# Patient Record
Sex: Male | Born: 2005 | Race: Black or African American | Hispanic: No | Marital: Single
Health system: Southern US, Community
[De-identification: ages and names within clinical notes are randomized; demographics above are authoritative.]

## PROBLEM LIST (undated history)

## (undated) DIAGNOSIS — F909 Attention-deficit hyperactivity disorder, unspecified type: Secondary | ICD-10-CM

## (undated) DIAGNOSIS — F913 Oppositional defiant disorder: Secondary | ICD-10-CM

## (undated) HISTORY — PX: CIRCUMCISION: SUR203

---
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
## 2024-01-03 IMAGING — DX DG CHEST 2V
2 series · 2 of 2 positions shown · non-contrast
Comparison: July 02, 2013.

CLINICAL DATA: Cough, history of spot on lung.

EXAM:
CHEST - 2 VIEW

[chest pa]
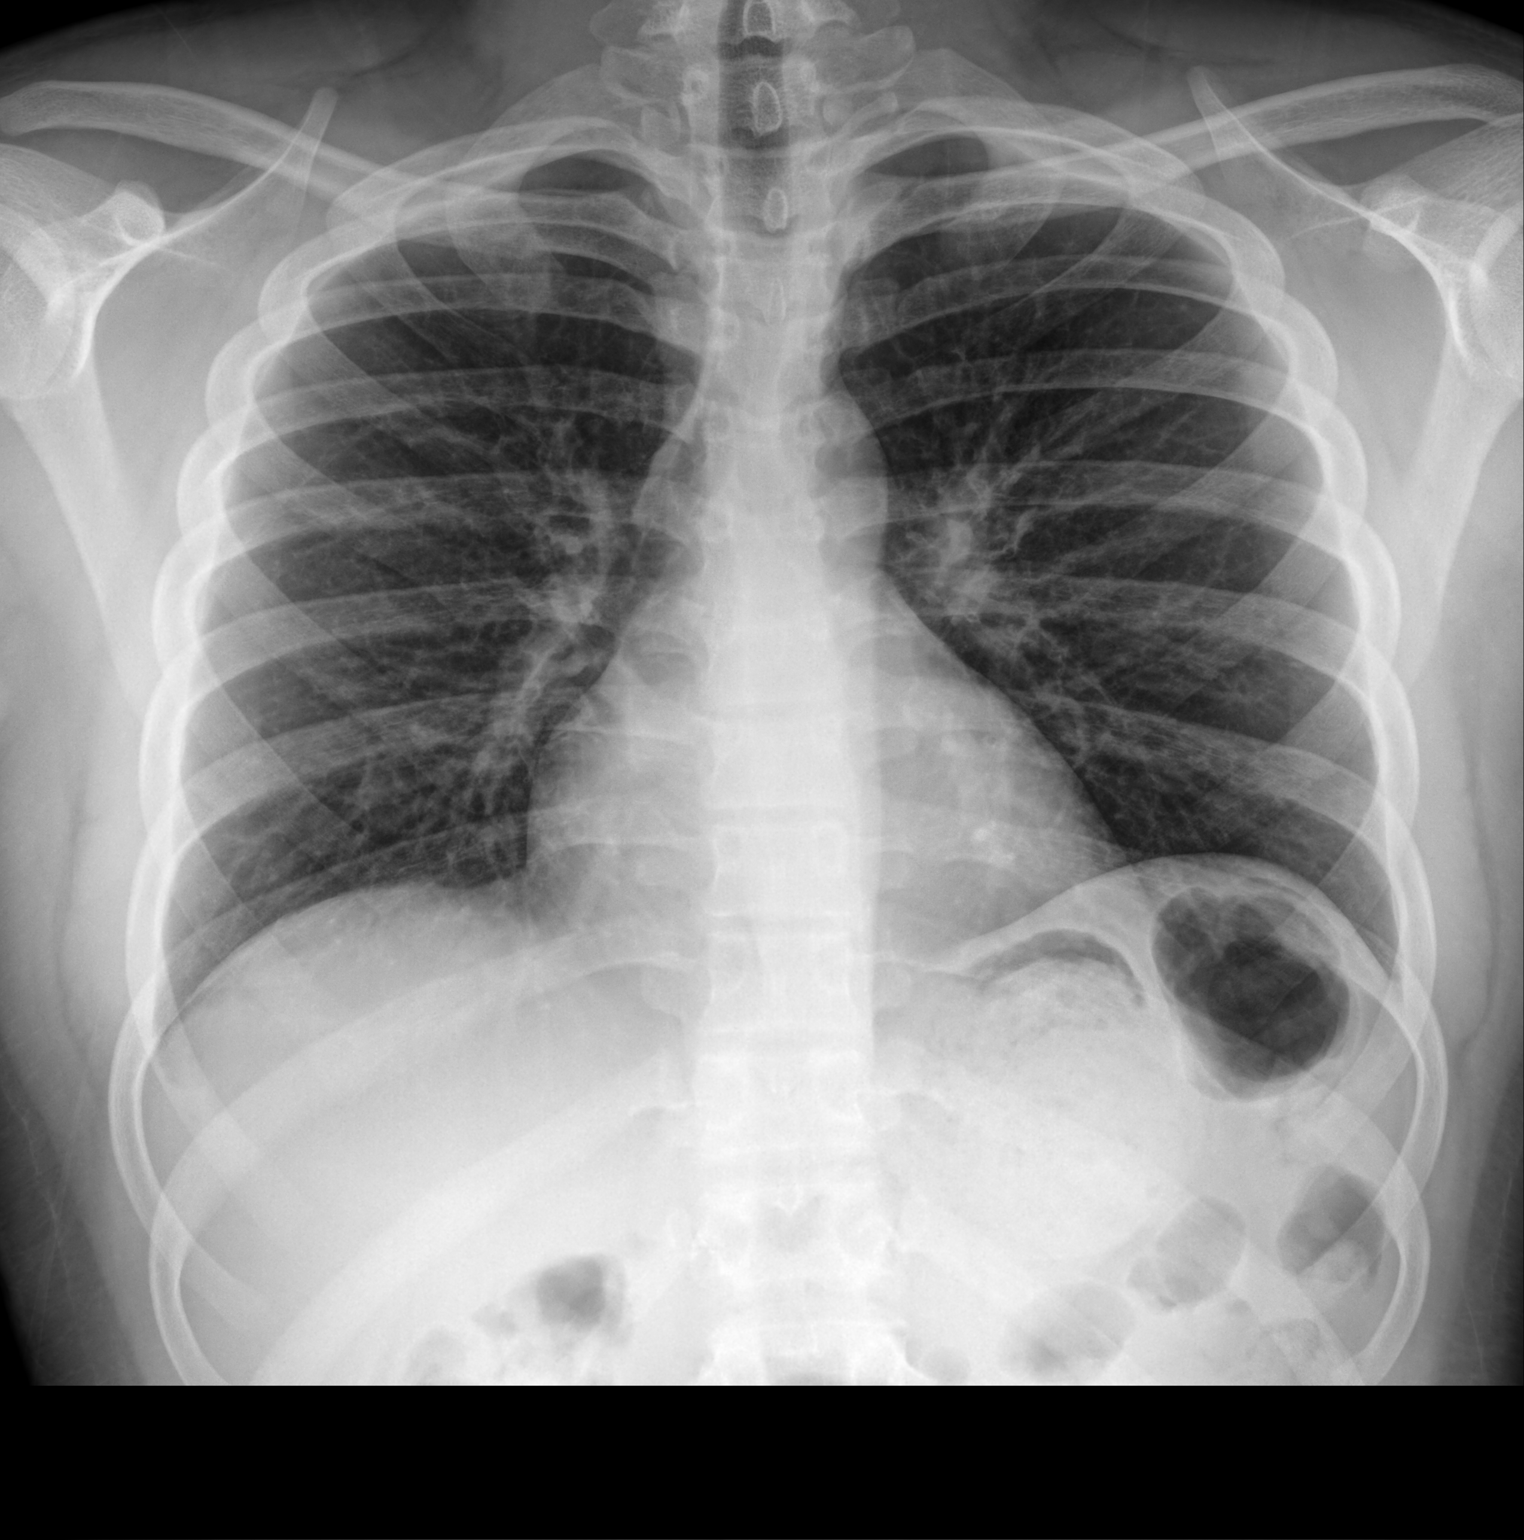

[chest lat]
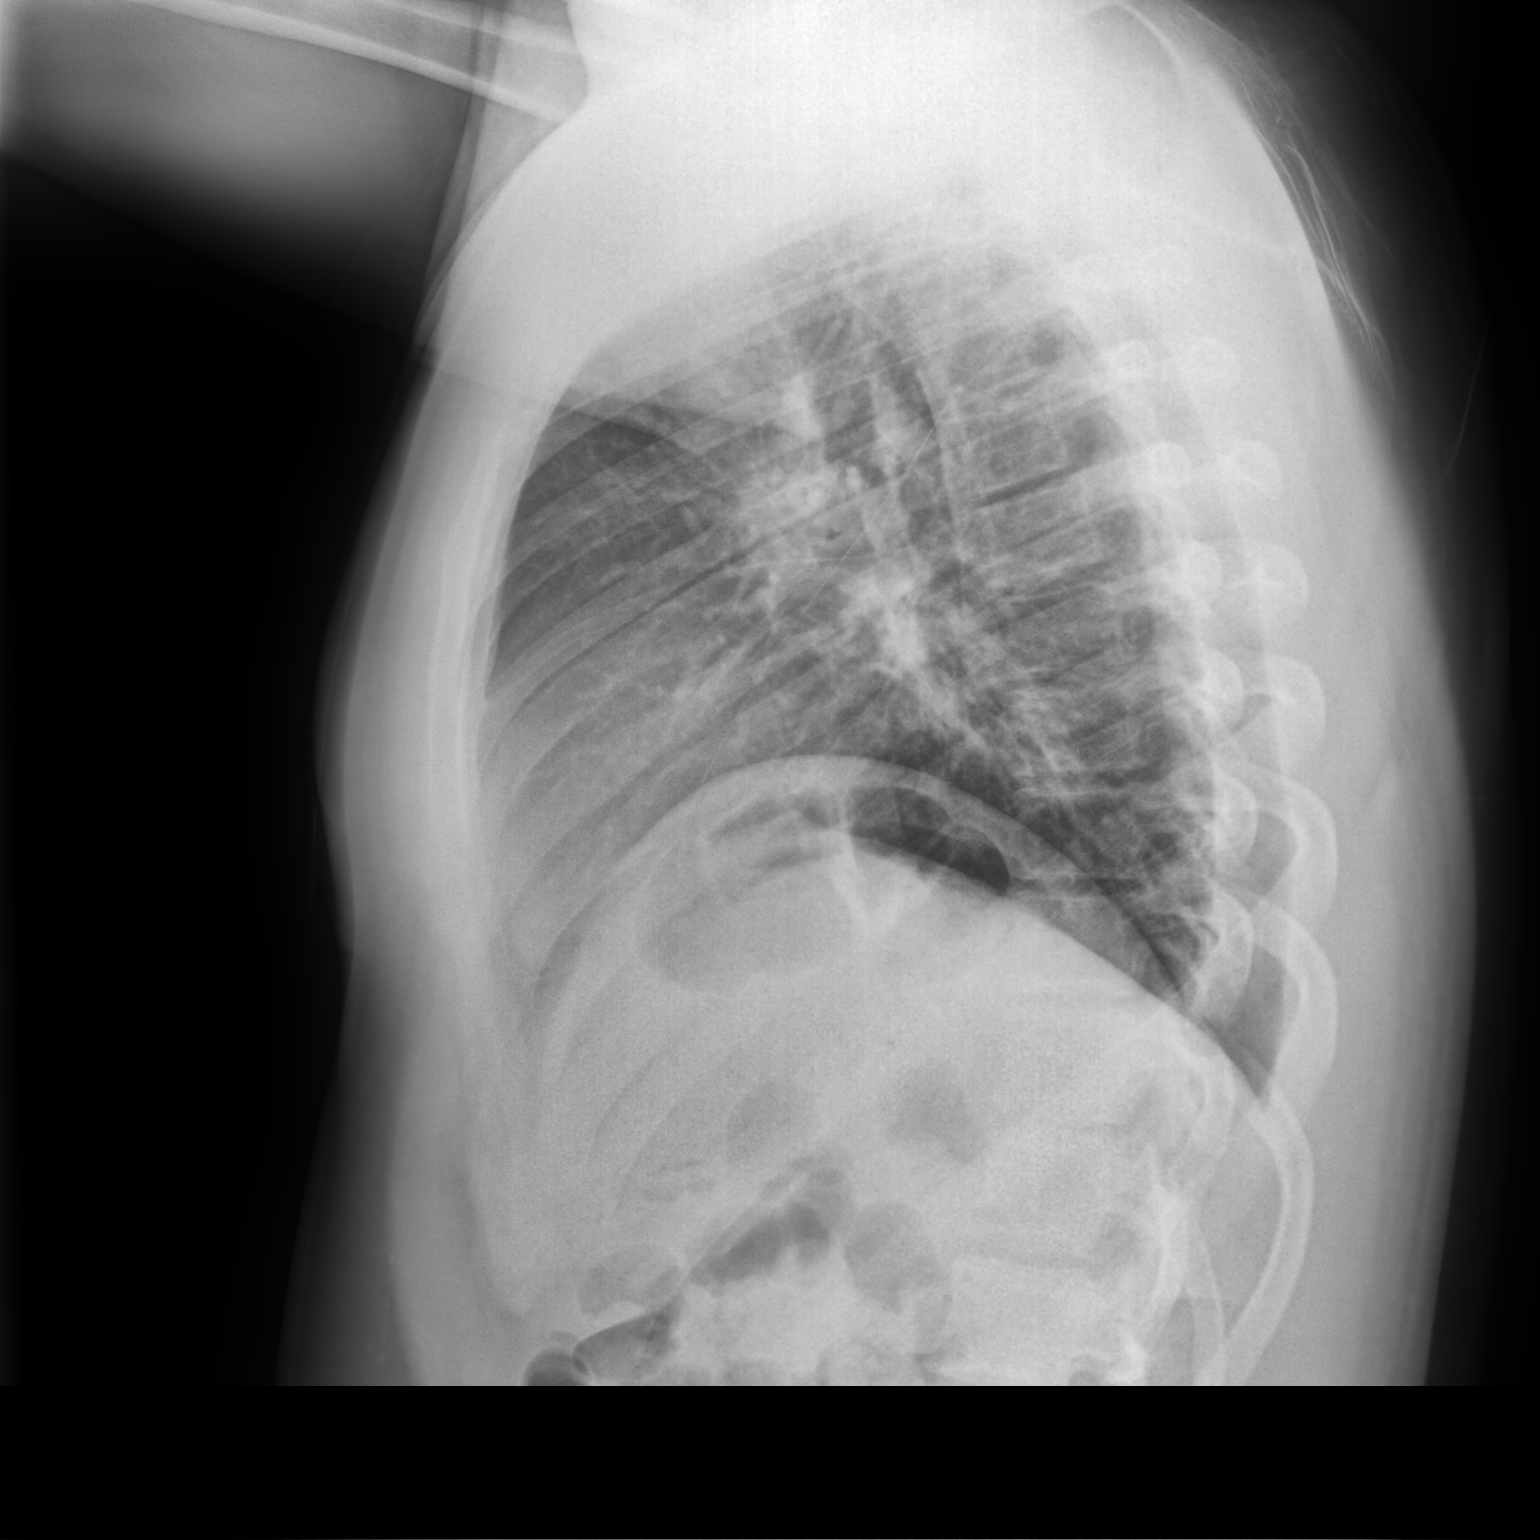

[2 of 2 positions shown; findings below may reference images not displayed]

FINDINGS: The heart size and mediastinal contours are within normal limits.
Perihilar predominant interstitial opacities suggesting viral
process or reactive airways disease in the appropriate clinical
setting. No focal airspace consolidation. The visualized skeletal
structures are unremarkable.
IMPRESSION: Radiographic findings suggesting a viral process or reactive airways
disease in the appropriate clinical setting. No focal airspace
consolidation.
